# Patient Record
Sex: Male | Born: 1990 | Race: White | Hispanic: No | Marital: Single | Smoking: Former smoker
Health system: Southern US, Community
[De-identification: ages and names within clinical notes are randomized; demographics above are authoritative.]

## PROBLEM LIST (undated history)

## (undated) DIAGNOSIS — R079 Chest pain, unspecified: Secondary | ICD-10-CM

## (undated) DIAGNOSIS — I456 Pre-excitation syndrome: Secondary | ICD-10-CM

## (undated) DIAGNOSIS — F909 Attention-deficit hyperactivity disorder, unspecified type: Secondary | ICD-10-CM

## (undated) HISTORY — PX: NO PAST SURGERIES: SHX2092

## (undated) HISTORY — DX: Attention-deficit hyperactivity disorder, unspecified type: F90.9

## (undated) HISTORY — DX: Pre-excitation syndrome: I45.6

## (undated) HISTORY — DX: Chest pain, unspecified: R07.9

---
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
## 2010-02-06 ENCOUNTER — Emergency Department (HOSPITAL_COMMUNITY): Admission: EM | Admit: 2010-02-06 | Discharge: 2010-02-06 | Payer: Self-pay | Admitting: Emergency Medicine

## 2010-07-22 ENCOUNTER — Emergency Department (HOSPITAL_COMMUNITY): Payer: BC Managed Care – PPO

## 2010-07-22 ENCOUNTER — Emergency Department (HOSPITAL_COMMUNITY)
Admission: EM | Admit: 2010-07-22 | Discharge: 2010-07-22 | Disposition: A | Discharge status: Home / Self Care | Payer: BC Managed Care – PPO | Attending: Emergency Medicine | Admitting: Emergency Medicine

## 2010-07-22 DIAGNOSIS — W298XXA Contact with other powered powered hand tools and household machinery, initial encounter: Secondary | ICD-10-CM | POA: Insufficient documentation

## 2010-07-22 DIAGNOSIS — S99919A Unspecified injury of unspecified ankle, initial encounter: Secondary | ICD-10-CM | POA: Insufficient documentation

## 2010-07-22 DIAGNOSIS — W01119A Fall on same level from slipping, tripping and stumbling with subsequent striking against unspecified sharp object, initial encounter: Secondary | ICD-10-CM | POA: Insufficient documentation

## 2010-07-22 DIAGNOSIS — I456 Pre-excitation syndrome: Secondary | ICD-10-CM | POA: Insufficient documentation

## 2010-07-22 DIAGNOSIS — S8990XA Unspecified injury of unspecified lower leg, initial encounter: Secondary | ICD-10-CM | POA: Insufficient documentation

## 2010-07-22 DIAGNOSIS — M79609 Pain in unspecified limb: Secondary | ICD-10-CM | POA: Insufficient documentation

## 2010-07-22 DIAGNOSIS — Y990 Civilian activity done for income or pay: Secondary | ICD-10-CM | POA: Insufficient documentation

## 2010-07-22 DIAGNOSIS — S91309A Unspecified open wound, unspecified foot, initial encounter: Secondary | ICD-10-CM | POA: Insufficient documentation

## 2010-07-25 LAB — ETHANOL: Alcohol, Ethyl (B): <5 mg/dL (ref 0–10)

## 2010-07-25 LAB — CBC
HCT: 37 % — ABNORMAL LOW (ref 39.0–52.0)
MCHC: 35.2 g/dL (ref 30.0–36.0)
MCV: 92.6 fL (ref 78.0–100.0)
Platelets: 219 10*3/uL (ref 150–400)
RDW: 12.4 % (ref 11.5–15.5)
WBC: 14.6 10*3/uL — ABNORMAL HIGH (ref 4.0–10.5)

## 2010-07-25 LAB — COMPREHENSIVE METABOLIC PANEL
Albumin: 4.3 g/dL (ref 3.5–5.2)
BUN: 18 mg/dL (ref 6–23)
Calcium: 9 mg/dL (ref 8.4–10.5)
Chloride: 109 mEq/L (ref 96–112)
Creatinine, Ser: 0.98 mg/dL (ref 0.4–1.5)
GFR calc Af Amer: >60 mL/min (ref >60)
Total Bilirubin: 0.8 mg/dL (ref 0.3–1.2)

## 2010-07-25 LAB — RAPID URINE DRUG SCREEN, HOSP PERFORMED
Amphetamines: NOT DETECTED
Barbiturates: NOT DETECTED
Benzodiazepines: NOT DETECTED
Cocaine: NOT DETECTED

## 2010-07-25 LAB — DIFFERENTIAL
Basophils Absolute: 0 10*3/uL (ref 0.0–0.1)
Lymphocytes Relative: 13 % (ref 12–46)
Lymphs Abs: 1.9 10*3/uL (ref 0.7–4.0)
Monocytes Absolute: 0.8 10*3/uL (ref 0.1–1.0)
Neutro Abs: 11.9 10*3/uL — ABNORMAL HIGH (ref 1.7–7.7)

## 2010-07-25 LAB — GLUCOSE, CAPILLARY

## 2010-09-02 NOTE — Letter (Signed)
December 07, 2007    Elvina Sidle, MD  [address]  [place], [place] [address]   RE:  NENO, HOHENSEE  MRN:  [id number]  /  DOB:  1990-12-07   Dear Kenyon Ana,   Thank you for referring Jay Lynch for EP evaluation and treatment  of SVT.  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  However in the last several months, his episodes  have increased in frequency and severity and have been associated with  dizziness, lightheadedness, and shortness of breath.  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  The patient states that these  spells typically occur with exercise, although in the last several  months, he has had them occurring spontaneously as previously noted.  He  was seen in your office on December 02, 2007, with the above symptoms.  His palpitations had actually resolved.   His family history is negative.  His parents were both with him today  and no significant problems.   SOCIAL HISTORY:  The patient denies tobacco or alcohol use.  He is  currently soon to be senior in high school.  He likes to wrestle  competitively.   His past medical history is otherwise negative.   His review of systems is as noted in the HPI.  He also notes some mild  headaches with his episodes of SVT.  Otherwise, all systems were  reviewed and were negative except as noted above.   PHYSICAL EXAMINATION:  GENERAL:  The patient is a very pleasant and very  well-appearing 20 year old young  man, in no acute distress.  VITAL SIGNS:  Blood pressure today was 110/64, the pulse was 48 and  regular, the respirations were 18, and the weight was 157 pounds.  HEENT:  Normocephalic and atraumatic.  Pupils equal and round.  The  oropharynx is moist.  His sclerae are anicteric.  NECK:  No jugular venous  distention.  There is no thyromegaly.  Trachea  was midline.  The carotids are 2+ and symmetric.  LUNGS:  Clear bilaterally to auscultation.  No wheezes, rales, or  rhonchi are present.  No increased work of breathing.  CARDIAC:  Regular rate and rhythm.  Normal S1 and S2.  There were no  murmurs, rubs, or gallops are present.  PMI was not enlarged nor was it  laterally displaced.  ABDOMEN:  Soft and nontender.  He was scaphoid.  EXTREMITIES:  No cyanosis, clubbing, or edema.  The pulses were 2+ and  symmetric.  NEUROLOGIC:  Alert and oriented x3.  His cranial nerves were intact.  His strength was 5/5 and symmetric.   His EKG demonstrates sinus rhythm with WPW syndrome and initial EKGs  demonstrate a R-wave in lead V1 and throughout the precordium.  EKG  today demonstrates negative QRS in lead V1 and a positive transition in  lead V2.   IMPRESSION:  Kenyon Ana, thanks for again referring Jay Lynch for EP  evaluation.  He clearly has Wolff-Parkinson-White syndrome and is  symptomatic.  I have outlined two possible treatment options, one would  be the initiation of beta-blocker therapy and the second would be  proceeding with electrophysiologic study and catheter ablation, which  would potentially be curative in this patient.  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  He may also have a second  right posterior septal accessory pathway based on the EKG today.  We  cannot determine about this for certain until we do his  electrophysiologic study.  I will keep you apprised as to how he is  doing.  Thanks again for referring him.    Sincerely,      Doylene Canning. Ladona Ridgel, MD  Electronically Signed    GWT/MedQ  DD: 12/07/2007  DT: 12/08/2007  Job #: [id number]

## 2010-09-02 NOTE — Assessment & Plan Note (Signed)
[facility]                                 ON-CALL NOTE   CAHLIL, SATTAR                      MRN:          [id number]  DATE:12/02/2007                            DOB:          1990/12/17    I received a page through the answering service and I promptly called  Dr. Milus Glazier back.  He stated that Jay Lynch 20 year old male  who is in his office had complained of intermittent chest discomfort.  EKG was obtained that showed Wolff-Parkinson-White syndrome.  Dr.  Milus Glazier felt the patient needed to follow up and was concerned  whether or not he should be placed on medication for his problem.  I  stated I would discuss this with Dr. Daleen Squibb and I would either call him  back or I would have Dr. Daleen Squibb call Dr. Milus Glazier back at the same phone  number [phone] and to await further instructions per our call.  Dr.  Milus Glazier agreed.      Dorian Pod, ACNP  Electronically Signed      Jesse Sans. Daleen Squibb, MD, [facility]  Electronically Signed   MB/MedQ  DD: 12/02/2007  DT: 12/03/2007  Job #: [id number]

## 2010-09-02 NOTE — Discharge Summary (Signed)
NAME:  [name], Jay Lynch             ACCOUNT NO.:  [id number]   MEDICAL RECORD NO.:  [id number]          PATIENT TYPE:  OIB   LOCATION:  3702                         FACILITY:  [facility]   PHYSICIAN:  Bevelyn Buckles. Bensimhon, MDDATE OF BIRTH:  05/22/1990   DATE OF ADMISSION:  12/23/2007  DATE OF DISCHARGE:  12/24/2007                               DISCHARGE SUMMARY   DISCHARGE DIAGNOSIS:  Wolff-Parkinson-White's with failed radiofrequency  ablation.   The patient being discharged home on Toprol-XL 25 mg daily x1 week, then  increase to 50 mg daily.  Follow up with Dr. Ladona Ridgel in 2-3 weeks.  Possible follow up with Dr. Sampson Goon at [facility].   Mr. [name] is a 20 year old Caucasian gentleman seen by Dr. Ladona Ridgel  recently for consultation for Wolff-Parkinson-White syndrome that is  symptomatic at the request of Dr. Elvina Sidle.  The patient was  offered 2 treatment options, initiation of beta-blocker therapy and/or  possible EP study with catheter ablation.  The patient and his  mother/family opted to proceed with EP study.  The patient underwent EP  study on December 23, 2007.  AP could not be successfully ablated.   The patient being discharged to home to follow up with Dr. Ladona Ridgel in 2-3  weeks for further evaluation.  He has been given a prescription for the  Toprol 50 mg half a tablet per week and then a full dose.  He may resume  school/activity slowly starting on Tuesday, December 27, 2007.  Follow  up with Dr. Ladona Ridgel, office will call to arrange appointment.   DURATION OF DISCHARGE ENCOUNTER:  Greater than 30 minutes.      Dorian Pod, ACNP      Bevelyn Buckles. Bensimhon, MD  Electronically Signed    MB/MEDQ  D:  12/24/2007  T:  12/24/2007  Job:  [id number]   cc:   Elvina Sidle, M.D.

## 2010-09-02 NOTE — Op Note (Signed)
NAME:  Jay Lynch, Jay Lynch NO.:  [id number]   MEDICAL RECORD NO.:  [id number]          PATIENT TYPE:  OIB   LOCATION:  3702                         FACILITY:  [facility]   PHYSICIAN:  Hillis Range, MD       DATE OF BIRTH:  1990-08-21   DATE OF PROCEDURE:  DATE OF DISCHARGE:                               OPERATIVE REPORT   EP STUDY NOTE   PRIMARY SURGEON:  Doylene Canning. Ladona Ridgel, MD.   FIRST ASSISTANT:  Hillis Range, MD.   PREPROCEDURE DIAGNOSIS:  Wolff-Parkinson-White syndrome.   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   PROCEDURES:  1. Diagnostic electrophysiology study.  2. Coronary sinus pacing and recording.  3. Mapping of supraventricular tachycardia.  4. Radiofrequency ablation of supraventricular tachycardia.   DESCRIPTION OF THE PROCEDURE:  An informed written consent was obtained  and the patient was brought to the Electrophysiology Lab in a fasting  state.  He was adequately sedated with intravenous medications as  outlined in the nursing report.  The patient's right neck and groin were  prepped and draped in the usual sterile fashion by the EP lab staff.  Using a percutaneous Seldinger technique, one 7-French hemostasis sheath  was placed into the right internal jugular vein.  A 6-French curved  Hexapolar catheter was introduced through the right internal jugular  vein and advanced into the coronary sinus for recording and pacing in  this location.  Two 6-French and one 8-French hemostasis sheaths were  placed into the right common femoral vein.  A 5-French [employer]  CRD catheter was introduced into the right common femoral vein and  advanced into the His bundle location.  A 5-French Josephson catheter  was introduced through the right common femoral vein and advanced into  the His bundle location.  The patient presented to the electrophysiology  lab in normal sinus rhythm.   Pre-excitation was observed on the surface  electrocardiogram.  This EKG pattern revealed a negative QRS complex in  V1 with a transition to a positive R-wave in V2.  I and VL were positive  and the QRS complex in the inferior leads (II,III,VF) were negative.  The R-R interval measured 1057 milliseconds with a PR interval of 92  milliseconds, QRS width 152 milliseconds, and QT interval of 618  milliseconds.  The AH interval measured 87 milliseconds with an HV  interval of 0 milliseconds.  Rapid ventricular pacing was performed down  to the cycle length of 270 milliseconds at which the patient converted  to atrial fibrillation.  The patient was successfully cardioverted to  sinus rhythm with a single synchronized 100 joule biphasic shock with a  cardioversion electrodes in the anterior and posterior thoracic  configuration.  Ventricular extrastimulus testing was performed down to  600/270 milliseconds, which produced non-sustained atrial fibrillation.  The retrograde atrial activation sequence revealed non-decremental VA  conduction with the earliest atrial activation recorded from the CS 5-6  electrode pair.  Atrial extrastimulus testing was then performed, which  revealed non-decremental AV conduction with an atrial ERP of 600/300  milliseconds.  The ERP of the accessory pathway could therefore not be  determined.  A 7-French Biosense Webster Celsius 4-mm ablation catheter  was introduced into the right common femoral vein and advanced into the  right atrium.  Electroanatomical mapping of the accessory pathway was  performed with ventricular pacing.  The earliest retrograde atrial  activation was found to arise just below the mouth of the coronary  sinus.  Radiofrequency current was delivered in this location, and  during ablation the accessory pathway briefly went away and revealed a  second accessory pathway with a positive QRS deflection in V1, which was  felt to be due to a second  accessory pathway.  The second accessory  pathway could not be electroanatomical mapped due to the dominance of  the first pathway.  Additional radiofrequency applications were  delivered in this location with a target temperature of 60 degrees at 50  watts with no effect on the accessory pathway.  Additional  electroanatomical mapping was performed within the coronary sinus,  however, no early atrial activations could be identified during  ventricular pacing in this location.  A coronary sinus diverticula or  middle cardiac vein could not be readily identified. An 8-French  hemostasis sheath was then placed into the right common femoral artery  for blood pressure monitoring.  The ablation catheter was removed from  the body and introduced into the right common femoral artery and  advanced into the left ventricle using a retrograde aortic approach.  Heparin was administered for adequate anti-coagulation intravenously.  Additional electroanatomical mapping along the septal mitral valve  annulus was then performed and a reasonably early atrial activation was  found during ventricular pacing along the septum from the left atrial  side.  Radiofrequency current was delivered in this location with no  effect on the accessory pathway.  Electroanatomical mapping was again  performed along the right periseptal and left periseptal regions as well  as within the coronary sinus.  The accessory pathway could not be  successfully ablated.  The procedure was, therefore, completed.  All  catheters were removed and the sheaths were aspirated and flushed.  The  patient was transferred to the postprocedure area for sheath removal per  protocol.  There were no early apparent complications.   CONCLUSIONS:  1. Normal sinus rhythm upon presentation.  2. Manifest posteroseptal accessory pathway.  3. Spontaneous atrial fibrillation successfully cardioverted.  4. A possible second left lateral accessory pathway  was briefly      observed.  However this pathway could not be electroanatomical      mapped due to the dominance of the presenting pathway.  5. Unsuccessful radiofrequency ablation of the posteroseptal accessory      pathway.  6. No early apparent complications.      Hillis Range, MD  Electronically Signed     JA/MEDQ  D:  12/23/2007  T:  12/24/2007  Job:  [id number]

## 2010-09-05 NOTE — Letter (Signed)
December 30, 2007    Teena Irani. Sampson Goon, MD  [facility] [place], [place] [address]   RE:  AMAURIS, DEBOIS  MRN:  [id number]  /  DOB:  1990/09/10   Dear Onalee Hua,   I am writing to refer you Ashley Jacobs, who recently underwent  electrophysiologic study and attempt at catheter ablation at [facility] on December 23, 2007.  The patient is 20 years old and has had  tachypalpitations for several years including spells of near syncope.  He, on EKG when I initially saw him, had clear-cut sinus rhythm with  preexcitation with a positive delta wave in lead V1 and a right bundle-  branch pattern consistent with a left lateral accessory pathway.  In our  office, however, the patient also had an EKG done, which demonstrated a  negative delta wave in lead II, III, and F, as well as a negative delta  wave in lead V1 transitioning to a positive delta wave in V2, and with  that, underwent an electrophysiologic study and an attempt at catheter  ablation.  At his EP study, mapping during ventricular pacing  demonstrated early, but not particularly early, activation in the right  posteroseptal space, but the earliest activation actually was at a  position in the coronary sinus at approximately 5 o'clock in the LAO  projection.  Initial RF energy application delivered at the lip of the  coronary sinus resulted during coronary sinus pacing in transient  termination of the patient's posteroseptal accessory pathway conduction,  which had also demonstrated activation of a left lateral pathway as  manifested by the right bundle-branch block pattern and continued  preexcitation during CS pacing.  Unfortunately, the patient's  posteroseptal accessory pathway conduction persisted after RF energy  application.  Extensive mapping was subsequently carried out both in the  right posteroseptal as well as the left posteroseptal  space as well as  in the coronary sinus with RF energy application delivered to the atrial  insertion of the pathway from the left posteroseptal side, from the  right posteroseptal side, and within the coronary sinus os, and at early  activation sites inside the coronary sinus.  Despite extensive mapping,  there was never a real early activation with fusion of the A and the V  during the CS pacing.  After several hours of attempted ablation, the  procedure was terminated, and the patient was ultimately discharged home  on beta-blocker therapy.   This particular patient is very active.  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  In reflection, I do not map the middle cardiac vein  area, and I wonder if the patient has a diverticulum in this region,  which might be, where his posteroseptal pathway emanates from.   Ivin Booty lives in [place], [place], and his phone number his  [phone].  Please do not hesitate to contact me for any questions that  might be related to Joshua's care or with regard to his EP study.  I  should also note that during the patient's EP study, pacing from the  right ventricle resulted in spontaneous initiation of atrial  fibrillation, which required cardioversion for termination.  His  earliest preexcited RR  interval was about 270 milliseconds.    Sincerely,      Doylene Canning. Ladona Ridgel, MD  Electronically Signed    GWT/MedQ  DD: 12/30/2007  DT: 12/30/2007  Job #: [id number]

## 2011-03-25 ENCOUNTER — Ambulatory Visit (INDEPENDENT_AMBULATORY_CARE_PROVIDER_SITE_OTHER): Payer: BC Managed Care – PPO

## 2011-03-25 DIAGNOSIS — R937 Abnormal findings on diagnostic imaging of other parts of musculoskeletal system: Secondary | ICD-10-CM

## 2011-03-25 DIAGNOSIS — M25539 Pain in unspecified wrist: Secondary | ICD-10-CM

## 2011-04-08 ENCOUNTER — Ambulatory Visit (INDEPENDENT_AMBULATORY_CARE_PROVIDER_SITE_OTHER): Payer: BC Managed Care – PPO

## 2011-04-08 DIAGNOSIS — S63509A Unspecified sprain of unspecified wrist, initial encounter: Secondary | ICD-10-CM

## 2011-04-08 DIAGNOSIS — M25539 Pain in unspecified wrist: Secondary | ICD-10-CM

## 2011-05-08 ENCOUNTER — Ambulatory Visit (INDEPENDENT_AMBULATORY_CARE_PROVIDER_SITE_OTHER): Payer: BC Managed Care – PPO

## 2011-05-08 DIAGNOSIS — F339 Major depressive disorder, recurrent, unspecified: Secondary | ICD-10-CM

## 2011-05-08 DIAGNOSIS — F988 Other specified behavioral and emotional disorders with onset usually occurring in childhood and adolescence: Secondary | ICD-10-CM

## 2011-05-27 ENCOUNTER — Encounter: Payer: Self-pay | Admitting: *Deleted

## 2011-05-28 ENCOUNTER — Encounter: Payer: Self-pay | Admitting: *Deleted

## 2011-05-28 ENCOUNTER — Ambulatory Visit (INDEPENDENT_AMBULATORY_CARE_PROVIDER_SITE_OTHER)
Admission: RE | Admit: 2011-05-28 | Discharge: 2011-05-28 | Disposition: A | Discharge status: Home / Self Care | Payer: BC Managed Care – PPO | Source: Ambulatory Visit | Attending: Cardiovascular Disease | Admitting: Cardiovascular Disease

## 2011-05-28 ENCOUNTER — Ambulatory Visit (INDEPENDENT_AMBULATORY_CARE_PROVIDER_SITE_OTHER): Payer: BC Managed Care – PPO | Admitting: Cardiovascular Disease

## 2011-05-28 DIAGNOSIS — R079 Chest pain, unspecified: Secondary | ICD-10-CM

## 2011-05-28 DIAGNOSIS — R062 Wheezing: Secondary | ICD-10-CM

## 2011-05-28 DIAGNOSIS — R002 Palpitations: Secondary | ICD-10-CM

## 2011-05-28 DIAGNOSIS — I456 Pre-excitation syndrome: Secondary | ICD-10-CM

## 2011-05-28 NOTE — Assessment & Plan Note (Signed)
Resolved.  Needs echo to R/O associated disease with WPW

## 2011-05-28 NOTE — Progress Notes (Signed)
Patient ID: Jay Lynch, male   DOB: Feb 01, 1991, 20 y.o.   MRN: [id number] 21 yo of Dr Ladona Ridgel.  History of WPW.  Had ablation attempt here in 2009.  Unsuccessful.  Posteroseptal pathway dominant but also ? Of second pathway posterolaterally.  Patient continues to have occasional palpitations.,  Nothing sustained and no syncope.  Given unsuccessful ablation, more than one pathway would not use Adderall  Patient apparantly has ADD and Dr Cleta Alberts was considering this.  No echo on chart to R/O other structural disease.  No SSCP.  Cough with wheezing  Smokes 1.5 ppd.  Would definatley avoid stimulants with all this nicotine on board.  Can consider nonstimulant med like Concerta  ROS: Denies fever, malais, weight loss, blurry vision, decreased visual acuity, cough, sputum, SOB, hemoptysis, pleuritic pain, palpitaitons, heartburn, abdominal pain, melena, lower extremity edema, claudication, or rash.  All other systems reviewed and negative   General: Affect appropriate Healthy:  appears stated age HEENT: normal Neck supple with no adenopathy JVP normal no bruits no thyromegaly Lungs rhonchi and expitory wheezing and good diaphragmatic motion Heart:  S1/S2 no murmur,rub, gallop or click PMI normal Abdomen: benighn, BS positve, no tenderness, no AAA no bruit.  No HSM or HJR Distal pulses intact with no bruits No edema Neuro non-focal Skin warm and dry No muscular weakness  Medications Current Outpatient Prescriptions  Medication Sig Dispense Refill  . escitalopram (LEXAPRO) 10 MG tablet Take 10 mg by mouth daily.        Allergies Phenergan  Family History: No family history on file.  Social History: History   Social History  . Marital Status: Single    Spouse Name: N/A    Number of Children: N/A  . Years of Education: N/A   Occupational History  . Not on file.   Social History Main Topics  . Smoking status: Current Everyday Smoker -- 1.5 packs/day    Types: Cigarettes  .  Smokeless tobacco: Not on file  . Alcohol Use: Not on file  . Drug Use: Not on file  . Sexually Active: Not on file   Other Topics Concern  . Not on file   Social History Narrative  . No narrative on file    Electrocardiogram:  SR rate 58 no baseline preexcitation.  PR 160 normal ECG  Assessment and Plan

## 2011-05-28 NOTE — Patient Instructions (Signed)
Your physician recommends that you schedule a follow-up appointment in: PENDING TEST RESULTS Your physician recommends that you continue on your current medications as directed. Please refer to the Current Medication list given to you today. A chest x-ray takes a picture of the organs and structures inside the chest, including the heart, lungs, and blood vessels. This test can show several things, including, whether the heart is enlarges; whether fluid is building up in the lungs; and whether pacemaker / defibrillator leads are still in place.DX WHEEZING SMOKER Your physician has requested that you have an echocardiogram. Echocardiography is a painless test that uses sound waves to create images of your heart. It provides your doctor with information about the size and shape of your heart and how well your heart's chambers and valves are working. This procedure takes approximately one hour. There are no restrictions for this procedure. DX WPW   Your physician has recommended that you have a pulmonary function test. Pulmonary Function Tests are a group of tests that measure how well air moves in and out of your lungs. DX WHEEZING

## 2011-05-28 NOTE — Assessment & Plan Note (Signed)
WPW failed ablation continued palpitations.  Posteroseptal pathway likely not able to be ablated due to risk of AV block and pacer.   Baseline ECG ok.  No Adderall  F/U Dr Ladona Ridgel in future

## 2011-05-28 NOTE — Assessment & Plan Note (Signed)
Needs CXR and PFTs  Counseled on smoking cessation.  Suggest nicorette gum as he is on lexapro already F/U Daub Inhaler if bronchoreactivity on PFT;s

## 2011-06-04 ENCOUNTER — Other Ambulatory Visit: Payer: Self-pay

## 2011-06-04 ENCOUNTER — Ambulatory Visit (HOSPITAL_COMMUNITY): Payer: BC Managed Care – PPO | Attending: Cardiology | Admitting: Radiology

## 2011-06-04 DIAGNOSIS — I456 Pre-excitation syndrome: Secondary | ICD-10-CM

## 2011-06-04 DIAGNOSIS — R079 Chest pain, unspecified: Secondary | ICD-10-CM | POA: Insufficient documentation

## 2011-06-04 DIAGNOSIS — R002 Palpitations: Secondary | ICD-10-CM | POA: Insufficient documentation

## 2011-06-12 ENCOUNTER — Ambulatory Visit (INDEPENDENT_AMBULATORY_CARE_PROVIDER_SITE_OTHER): Payer: BC Managed Care – PPO | Admitting: Internal Medicine

## 2011-06-12 DIAGNOSIS — R062 Wheezing: Secondary | ICD-10-CM

## 2011-06-12 NOTE — Progress Notes (Signed)
PFT done today. 

## 2011-06-15 LAB — PULMONARY FUNCTION TEST

## 2011-07-02 ENCOUNTER — Encounter: Payer: Self-pay | Admitting: Family Medicine

## 2011-07-02 ENCOUNTER — Encounter: Payer: Self-pay | Admitting: Cardiovascular Disease

## 2011-07-02 ENCOUNTER — Ambulatory Visit (INDEPENDENT_AMBULATORY_CARE_PROVIDER_SITE_OTHER): Payer: BC Managed Care – PPO | Admitting: Family Medicine

## 2011-07-02 DIAGNOSIS — R002 Palpitations: Secondary | ICD-10-CM

## 2011-07-02 DIAGNOSIS — R079 Chest pain, unspecified: Secondary | ICD-10-CM

## 2011-07-02 DIAGNOSIS — R110 Nausea: Secondary | ICD-10-CM

## 2011-07-02 NOTE — Patient Instructions (Addendum)
After consult with cardiology, your chest pain is unlikely to be cardiac in nature.  Take Zantac twice per day for next few days, and avoid spicy foods/fast food, and decrease stimulant use (caffeine and nicotine).  If any worsening of symptoms overnight, or new symptoms - go to emergency Room or call 911.  If symptoms have not resolved, can call cardiology office in the morning to be seen.  Follow up with cardiologist if palpitations persist.

## 2011-07-02 NOTE — Progress Notes (Signed)
Subjective:    Patient ID: Jay Lynch, male    DOB: [birth year], 20 y.o.   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  Had catheter ablation at [facility] 12/23/07, not successful per pt, had 2nd ablation in 02/03/08.  Had been doing ok except every now and then (heart races for 5-10 minutes every 2 or 3 weeks to every month. [name] recently evaluated (Dr. Eden Emms) with echo, and pulmonary testing prior to ADD meds. Has not started any stimulants/new meds.  Off lexapro for 2-3 weeks - rx ran out.   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 No new activities at work.  Sx's started as starting to wash car. Still some soreness in chest.  Feels sick on stomach currently, nausea only, no vomiting.  Short of breath at work only.  Pain 6-7/10.  3/10 currently.  Left and center chest, no arm or jaw radiation.  Slight burping/heartburn symptoms, and ate [employer] 1-1/2 hours prior.  SH: denies energy drinks, but drinks caffeine.  1 and 1/2 cokes today.  3/4 ppd tobacco.  Denies cocaine, or other illicit drug use.  Has been playing basetball for few hours each night without worsening of palpitations and only slight soreness as back into exercise  No recent airline travel, or prolonged car rides, but did notice some pain in Left upper calf, slight swelling after basketball 3 days ago - less sore/swollen now.   Review of Systems  Respiratory: Positive for chest tightness and shortness of breath.        Dyspnea during episode - not currently.  Cardiovascular: Positive for chest pain and palpitations.  Neurological: Negative for syncope.       Objective:   Physical Exam  Constitutional: He is oriented to person, place, and time. Vital signs are normal. He appears well-developed and well-nourished. No distress.       Appears comfortable.  HENT:  Head: Normocephalic  and atraumatic.  Eyes: EOM are normal. Pupils are equal, round, and reactive to light.  Neck: Neck supple. Carotid bruit is not present. No thyromegaly present.  Cardiovascular: Normal rate, regular rhythm, S1 normal, S2 normal, normal heart sounds and intact distal pulses.   No extrasystoles are present. Exam reveals no gallop and no friction rub.   No murmur heard. Pulmonary/Chest: Effort normal and breath sounds normal. He has no wheezes. He has no rales.  Lymphadenopathy:    He has no cervical adenopathy.  Neurological: He is alert and oriented to person, place, and time.  Skin: Skin is warm and dry. No rash noted.  Psychiatric: He has a normal mood and affect. His behavior is normal.    EKG: sinus rhythm, early repol.TWI in 2 only.      Assessment & Plan:  Jay Lynch is a 21 y.o. male Hx of WPW s/p ablation x 2, with intermittent palpitatons (every 3 weeks to 1 month), with acute onset of chest pain, dizziness, slight shortness of breath.  Now improving, appears comfortable in office without distress, no further shortness of breath, HR 60's - 70's on monitor.  No ectopy or pauses., no acute concerning findings on EKG.  D/w cardiologist at [facility] cardi[facility].  Pt had normal echo recently, and unlikely to be cardiac cause of pain, but if symptoms still present in am, call [place] cards for  OV.   Zantac 150mg  given in office and samples x 3 - take twice per day, and avoid spicy foods/fast food, and decrease stimulant use (caffeine and nicotine).  If any worsening of symptoms overnight, or new symptoms - to emergency Room/911.  If symptoms persist in am, pt to follow up with cardiology.  Also encouraged to schedule appointment if palpitation episodes persist.  Understanding expressed and in agreement with plan.

## 2011-07-23 ENCOUNTER — Encounter: Payer: Self-pay | Admitting: Family Medicine

## 2011-07-28 ENCOUNTER — Encounter: Payer: Self-pay | Admitting: Emergency Medicine

## 2012-09-05 ENCOUNTER — Telehealth: Payer: Self-pay | Admitting: *Deleted

## 2012-09-05 NOTE — Telephone Encounter (Signed)
Will fax this note and last office note to Dr. Warren Danes.

## 2012-09-05 NOTE — Telephone Encounter (Signed)
Ok to have work on teeth This is Dr Lubertha Basque patient

## 2012-09-05 NOTE — Telephone Encounter (Signed)
Received call from Dr. Hewitt Blade. Per Dr. Warren Danes pt needs to have wisdom teeth approved. This would be done in office with local anesthesia and nitrous oxide.  Dr. Warren Danes is requesting office notes from visits with Dr. Eden Emms and clearance from Dr. Eden Emms for wisdom teeth removal.  Fax number--[phone]. Phone number--[phone].

## 2012-11-02 ENCOUNTER — Encounter (HOSPITAL_COMMUNITY): Payer: Self-pay | Admitting: Emergency Medicine

## 2012-11-02 ENCOUNTER — Emergency Department (HOSPITAL_COMMUNITY)
Admission: EM | Admit: 2012-11-02 | Discharge: 2012-11-02 | Disposition: A | Discharge status: Home / Self Care | Payer: BC Managed Care – PPO | Attending: Emergency Medicine | Admitting: Emergency Medicine

## 2012-11-02 DIAGNOSIS — S0920XA Traumatic rupture of unspecified ear drum, initial encounter: Secondary | ICD-10-CM | POA: Insufficient documentation

## 2012-11-02 DIAGNOSIS — Y929 Unspecified place or not applicable: Secondary | ICD-10-CM | POA: Insufficient documentation

## 2012-11-02 DIAGNOSIS — Y9389 Activity, other specified: Secondary | ICD-10-CM | POA: Insufficient documentation

## 2012-11-02 DIAGNOSIS — Z8659 Personal history of other mental and behavioral disorders: Secondary | ICD-10-CM | POA: Insufficient documentation

## 2012-11-02 DIAGNOSIS — Z8679 Personal history of other diseases of the circulatory system: Secondary | ICD-10-CM | POA: Insufficient documentation

## 2012-11-02 DIAGNOSIS — H7211 Attic perforation of tympanic membrane, right ear: Secondary | ICD-10-CM

## 2012-11-02 DIAGNOSIS — X58XXXA Exposure to other specified factors, initial encounter: Secondary | ICD-10-CM | POA: Insufficient documentation

## 2012-11-02 DIAGNOSIS — F17200 Nicotine dependence, unspecified, uncomplicated: Secondary | ICD-10-CM | POA: Insufficient documentation

## 2012-11-02 MED ORDER — CIPROFLOXACIN-DEXAMETHASONE 0.3-0.1 % OT SUSP: 4.0000 [drp] | Freq: Two times a day (BID) | OTIC | Status: DC

## 2012-11-02 NOTE — ED Notes (Signed)
PT. REPORTS RIGHT EAR PAIN / BLEEDING INJURED THIS MORNING WITH A Q-TIP .

## 2012-11-02 NOTE — ED Provider Notes (Signed)
   History    CSN: [id number] Arrival date & time 11/02/12  0310  First MD Initiated Contact with Patient 11/02/12 [phone]     Chief Complaint  Patient presents with  . Ear Injury   (Consider location/radiation/quality/duration/timing/severity/associated sxs/prior Treatment) HPI  Patient is a generally healthy young man who sustained trauma to the right ear just PTA. He was sitting on bed with Q tip in his right ear, next to his GF. She nudged him and he fell onto pillow which caused Q tip to become impacted in right ear. Patient noted immediate pain and has had subjective hearing loss. He denies injury to any other region. Pain is mild in severity and aching in nature. Non radiating. Td is utd.  Past Medical History  Diagnosis Date  . Anomalous atrioventricular excitation   . CHEST PAIN   . Wolf-Parkinson-White syndrome   . ADD (attention deficit disorder with hyperactivity)    History reviewed. No pertinent past surgical history. No family history on file. History  Substance Use Topics  . Smoking status: Current Every Day Smoker -- 1.50 packs/day    Types: Cigarettes  . Smokeless tobacco: Not on file  . Alcohol Use: Yes    Review of Systems Gen: no weight loss, fevers, chills, night sweats Nose: no epistaxis or rhinorrhea Ears: as per hpi.  Mouth: no dental pain, no sore throat Neck: no neck pain  Allergies  Promethazine hcl  Home Medications   Current Outpatient Rx  Name  Route  Sig  Dispense  Refill  . ciprofloxacin-dexamethasone (CIPRODEX) otic suspension   Right Ear   Place 4 drops into the right ear 2 (two) times daily.   7.5 mL   0    BP 125/57  Pulse 50  Temp(Src) 97.9 F (36.6 C) (Oral)  Resp 20  SpO2 98% Physical Exam Gen: well developed and well nourished appearing Head: NCAT Eyes: PERL, EOMI Ears: left TM wnl, Righ EAC has small amount of clotted blood, the TM appear to be perforated, no active bleeding, no FB Nose: no epistaixis or  rhinorrhea Mouth/throat: mucosa is moist and pink Neck: supple, no stridor Neuro: CN ii-xii grossly intact, no focal deficits Psyche; normal affect,  calm and cooperative.   ED Course  Procedures (including critical care time) Labs Reviewed - No data to display No results found. 1. Tympanic membrane attic perforation, right     MDM  Case briefly discussed with Dr. Pollyann Kennedy via phone. He recommends water precautions, Ciprodex gtt and f/u in his office in 2 days. Instructions and plan relayed to patient and his father.   Brandt Loosen, MD 11/02/12 [phone]

## 2013-01-21 ENCOUNTER — Emergency Department (HOSPITAL_COMMUNITY)
Admission: EM | Admit: 2013-01-21 | Discharge: 2013-01-22 | Disposition: A | Discharge status: Home / Self Care | Payer: BC Managed Care – PPO | Attending: Emergency Medicine | Admitting: Emergency Medicine

## 2013-01-21 ENCOUNTER — Emergency Department (HOSPITAL_COMMUNITY): Payer: BC Managed Care – PPO

## 2013-01-21 ENCOUNTER — Encounter (HOSPITAL_COMMUNITY): Payer: Self-pay | Admitting: *Deleted

## 2013-01-21 DIAGNOSIS — Y929 Unspecified place or not applicable: Secondary | ICD-10-CM | POA: Insufficient documentation

## 2013-01-21 DIAGNOSIS — M25552 Pain in left hip: Secondary | ICD-10-CM

## 2013-01-21 DIAGNOSIS — Y9389 Activity, other specified: Secondary | ICD-10-CM | POA: Insufficient documentation

## 2013-01-21 DIAGNOSIS — W208XXA Other cause of strike by thrown, projected or falling object, initial encounter: Secondary | ICD-10-CM | POA: Insufficient documentation

## 2013-01-21 DIAGNOSIS — T148XXA Other injury of unspecified body region, initial encounter: Secondary | ICD-10-CM

## 2013-01-21 DIAGNOSIS — S79919A Unspecified injury of unspecified hip, initial encounter: Secondary | ICD-10-CM | POA: Insufficient documentation

## 2013-01-21 DIAGNOSIS — Z8659 Personal history of other mental and behavioral disorders: Secondary | ICD-10-CM | POA: Insufficient documentation

## 2013-01-21 DIAGNOSIS — F17200 Nicotine dependence, unspecified, uncomplicated: Secondary | ICD-10-CM | POA: Insufficient documentation

## 2013-01-21 DIAGNOSIS — Z8679 Personal history of other diseases of the circulatory system: Secondary | ICD-10-CM | POA: Insufficient documentation

## 2013-01-21 DIAGNOSIS — S20229A Contusion of unspecified back wall of thorax, initial encounter: Secondary | ICD-10-CM | POA: Insufficient documentation

## 2013-01-21 MED ORDER — METHOCARBAMOL 500 MG PO TABS
750.0000 mg | ORAL_TABLET | Freq: Once | ORAL | Status: AC
  Administered 2013-01-21: 750 mg via ORAL
  Filled 2013-01-21 (×2): qty 2

## 2013-01-21 MED ORDER — IBUPROFEN 400 MG PO TABS
600.0000 mg | ORAL_TABLET | Freq: Once | ORAL | Status: AC
  Administered 2013-01-21: 600 mg via ORAL
  Filled 2013-01-21 (×2): qty 1

## 2013-01-21 NOTE — ED Provider Notes (Signed)
CSN: [id number]     Arrival date & time 01/21/13  2236 History   First MD Initiated Contact with Patient 01/21/13 2247     Chief Complaint  Patient presents with  . Back Pain  . Hip Pain   (Consider location/radiation/quality/duration/timing/severity/associated sxs/prior Treatment) HPI Comments: The car fell on him--he rolled to the side and it hit him on the L iliac crest. The car was jacked up and he continued to work.  He has not taken any pain medication, he has been ambulatory but with progressive increase in pain   Patient is a 22 y.o. male presenting with back pain and hip pain. The history is provided by the patient.  Back Pain Location:  Lumbar spine Quality:  Aching Radiates to:  Does not radiate Pain severity:  Moderate Onset quality:  Sudden Duration:  11 hours Progression:  Worsening Chronicity:  New Context comment:  Car fell off lift landing on patient  Relieved by:  None tried Worsened by:  Ambulation Ineffective treatments:  None tried Associated symptoms: pelvic pain   Associated symptoms: no abdominal pain, no bladder incontinence, no bowel incontinence, no fever, no leg pain, no numbness, no paresthesias, no perianal numbness, no tingling and no weakness   Hip Pain Pertinent negatives include no abdominal pain, chills, fever, joint swelling, numbness or weakness.    Past Medical History  Diagnosis Date  . Anomalous atrioventricular excitation   . CHEST PAIN   . Wolf-Parkinson-White syndrome   . ADD (attention deficit disorder with hyperactivity)    History reviewed. No pertinent past surgical history. History reviewed. No pertinent family history. History  Substance Use Topics  . Smoking status: Current Every Day Smoker -- 1.50 packs/day    Types: Cigarettes  . Smokeless tobacco: Not on file  . Alcohol Use: Yes    Review of Systems  Constitutional: Negative for fever and chills.  Gastrointestinal: Negative for abdominal pain and bowel  incontinence.  Genitourinary: Positive for pelvic pain. Negative for bladder incontinence and flank pain.  Musculoskeletal: Positive for back pain and gait problem. Negative for joint swelling.  Skin: Negative for wound.  Neurological: Negative for tingling, weakness, numbness and paresthesias.  All other systems reviewed and are negative.    Allergies  Promethazine hcl  Home Medications   Current Outpatient Rx  Name  Route  Sig  Dispense  Refill  . HYDROcodone-acetaminophen (NORCO/VICODIN) 5-325 MG per tablet   Oral   Take 1 tablet by mouth every 4 (four) hours as needed for pain (sever pain at night).   10 tablet   0   . ibuprofen (ADVIL,MOTRIN) 600 MG tablet   Oral   Take 1 tablet (600 mg total) by mouth every 8 (eight) hours as needed for pain.   30 tablet   0   . methocarbamol (ROBAXIN) 750 MG tablet   Oral   Take 1 tablet (750 mg total) by mouth 3 (three) times daily.   15 tablet   0    BP 135/70  Pulse 114  Temp(Src) 98.2 F (36.8 C) (Oral)  Resp 16  Wt 176 lb 7 oz (80.032 kg)  BMI 24.27 kg/m2  SpO2 100% Physical Exam  Nursing note and vitals reviewed. Constitutional: He is oriented to person, place, and time. He appears well-developed and well-nourished.  HENT:  Head: Normocephalic and atraumatic.  Eyes: Pupils are equal, round, and reactive to light.  Neck: Normal range of motion.  Cardiovascular: Normal rate.   Pulmonary/Chest: Effort normal.  Abdominal:  Soft. He exhibits no distension.  Musculoskeletal: Normal range of motion. He exhibits tenderness. He exhibits no edema.       Back:  Neurological: He is alert and oriented to person, place, and time.  Skin: Skin is warm.    ED Course  Procedures (including critical care time) Labs Review Labs Reviewed - No data to display Imaging Review Dg Pelvis 1-2 Views  01/21/2013   *RADIOLOGY REPORT*  Clinical Data: Car fell onto patient; left hip pain.  PELVIS - 1-2 VIEW  Comparison: None.  Findings:  There is no evidence of fracture or dislocation.  Both femoral heads are seated normally within their respective acetabula.  No significant degenerative change is appreciated.  The sacroiliac joints are unremarkable in appearance.  The visualized bowel gas pattern is grossly unremarkable in appearance.  IMPRESSION: No evidence of fracture or dislocation.   Original Report Authenticated By: Tonia Ghent, M.D.    MDM   1. Contusion   2. Hip pain, acute, left    Xray is normal patient will be treated with antiinflammatory , muscle relaxer and Vicodin at night     Arman Filter, NP 01/22/13 0020  Arman Filter, NP 01/22/13 0020

## 2013-01-21 NOTE — ED Notes (Addendum)
Pt states that he was working on his car and his transmission came out and then the car landed on his left hip and back. Pt states that he then had his girlfriend jack the car back up and continued to work on the car. Pt states after he was finished he wanted to be seen. Pt ambualtory/able to bare weight.

## 2013-01-22 MED ORDER — IBUPROFEN 600 MG PO TABS: 600.0000 mg | ORAL_TABLET | Freq: Three times a day (TID) | ORAL | Status: DC | PRN

## 2013-01-22 MED ORDER — METHOCARBAMOL 750 MG PO TABS: 750.0000 mg | ORAL_TABLET | Freq: Three times a day (TID) | ORAL | Status: DC

## 2013-01-22 MED ORDER — HYDROCODONE-ACETAMINOPHEN 5-325 MG PO TABS: 1.0000 | ORAL_TABLET | ORAL | Status: DC | PRN

## 2013-01-22 NOTE — ED Provider Notes (Signed)
Medical screening examination/treatment/procedure(s) were performed by non-physician practitioner and as supervising physician I was immediately available for consultation/collaboration.   Darlys Gales, MD 01/22/13 [phone]

## 2013-09-20 ENCOUNTER — Telehealth: Payer: Self-pay | Admitting: Cardiovascular Disease

## 2013-09-20 NOTE — Telephone Encounter (Signed)
LEFT MESSAGE FOR  CALL BACK ./CY 

## 2013-09-20 NOTE — Telephone Encounter (Signed)
New problem   Pt's mom need a release letter for pt to start donating plasma. Please advise.

## 2013-09-28 NOTE — Telephone Encounter (Signed)
LMTCB ./CY WILL AWAIT  RETURN CALL FROM PT'S MOM./CY

## 2014-05-31 ENCOUNTER — Ambulatory Visit (INDEPENDENT_AMBULATORY_CARE_PROVIDER_SITE_OTHER): Payer: BLUE CROSS/BLUE SHIELD | Admitting: Family Medicine

## 2014-05-31 VITALS — BP 118/68 | HR 64 | Temp 98.2°F | Resp 16 | Ht 71.25 in | Wt 187.2 lb

## 2014-05-31 DIAGNOSIS — Z202 Contact with and (suspected) exposure to infections with a predominantly sexual mode of transmission: Secondary | ICD-10-CM

## 2014-05-31 DIAGNOSIS — R059 Cough, unspecified: Secondary | ICD-10-CM

## 2014-05-31 DIAGNOSIS — R05 Cough: Secondary | ICD-10-CM

## 2014-05-31 DIAGNOSIS — L409 Psoriasis, unspecified: Secondary | ICD-10-CM

## 2014-05-31 DIAGNOSIS — M25562 Pain in left knee: Secondary | ICD-10-CM

## 2014-05-31 DIAGNOSIS — M25561 Pain in right knee: Secondary | ICD-10-CM

## 2014-05-31 DIAGNOSIS — Z720 Tobacco use: Secondary | ICD-10-CM

## 2014-05-31 MED ORDER — AZITHROMYCIN 250 MG PO TABS
ORAL_TABLET | ORAL | Status: DC
Start: 2014-05-31 — End: 2015-06-14

## 2014-05-31 MED ORDER — TRIAMCINOLONE ACETONIDE 0.1 % EX CREA: 1.0000 "application " | TOPICAL_CREAM | Freq: Two times a day (BID) | CUTANEOUS | Status: DC

## 2014-05-31 NOTE — Patient Instructions (Addendum)
You should receive a call or letter about your lab results within the next week to 10 days.  Start azithromycin as discussed. I do recommend other testing such as HIV and syphilis testing as well.  Let me know if you change your mind.   Your rash on arms and knees appears to be psoriasis and this can sometimes also cause arthritis or knee pain.  You can start with the steroid cream to your elbows and knees, advil or alleve for pain for now, but return to discuss this further and other possible workup or treatment.   mucinex if needed for cough.  If cough not improving in next week - recheck for possible xray.  Keep up with cutting back on smoking. [facility] offers smoking cessation clinics. Registration is required. To register call [phone] or register online at [email or web address].   Return to the clinic or go to the nearest emergency room if any of your symptoms worsen or new symptoms occur.  Psoriasis Psoriasis is a common, long-lasting (chronic) inflammation of the skin. It affects both men and women equally, of all ages and all races. Psoriasis cannot be passed from person to person (not contagious). Psoriasis varies from mild to very severe. When severe, it can greatly affect your quality of life. Psoriasis is an inflammatory disorder affecting the skin as well as other organs including the joints (causing an arthritis). With psoriasis, the skin sheds its top layer of cells more rapidly than it does in someone without psoriasis. CAUSES  The cause of psoriasis is largely unknown. Genetics, your immune system, and the environment seem to play a role in causing psoriasis. Factors that can make psoriasis worse include:  Damage or trauma to the skin, such as cuts, scrapes, and sunburn. This damage often causes new areas of psoriasis (lesions).  Winter dryness and lack of sunlight.  Medicines such as lithium, beta-blockers, antimalarial drugs, ACE inhibitors, nonsteroidal anti-inflammatory  drugs (ibuprofen, aspirin), and terbinafine. Let your caregiver know if you are taking any of these drugs.  Alcohol. Excessive alcohol use should be avoided if you have psoriasis. Drinking large amounts of alcohol can affect:  How well your psoriasis treatment works.  How safe your psoriasis treatment is.  Smoking. If you smoke, ask your caregiver for help to quit.  Stress.  Bacterial or viral infections.  Arthritis. Arthritis associated with psoriasis (psoriatic arthritis) affects less than 10% of patients with psoriasis. The arthritic intensity does not always match the skin psoriasis intensity. It is important to let your caregiver know if your joints hurt or if they are stiff. SYMPTOMS  The most common form of psoriasis begins with little red bumps that gradually become larger. The bumps begin to form scales that flake off easily. The lower layers of scales stick together. When these scales are scratched or removed, the underlying skin is tender and bleeds easily. These areas then grow in size and may become large. Psoriasis often creates a rash that looks the same on both sides of the body (symmetrical). It often affects the elbows, knees, groin, genitals, arms, legs, scalp, and nails. Affected nails often have pitting, loosen, thicken, crumble, and are difficult to treat.  "Inverse psoriasis"occurs in the armpits, under breasts, in skin folds, and around the groin, buttocks, and genitals.  "Guttate psoriasis" generally occurs in children and young adults following a recent sore throat (strep throat). It begins with many small, red, scaly spots on the skin. It clears spontaneously in weeks or a few months  without treatment. DIAGNOSIS  Psoriasis is diagnosed by physical exam. A tissue sample (biopsy) may also be taken. TREATMENT The treatment of psoriasis depends on your age, health, and living conditions.  Steroid (cortisone) creams, lotions, and ointments may be used. These  treatments are associated with thinning of the skin, blood vessels that get larger (dilated), loss of skin pigmentation, and easy bruising. It is important to use these steroids as directed by your caregiver. Only treat the affected areas and not the normal, unaffected skin. People on long-term steroid treatment should wear a medical alert bracelet. Injections may be used in areas that are difficult to treat.  Scalp treatments are available as shampoos, solutions, sprays, foams, and oils. Avoid scratching the scalp and picking at the scales.  Anthralin medicine works well on areas that are difficult to treat. However, it stains clothes and skin and may cause temporary irritation.  Synthetic vitamin D (calcipotriene)can be used on small areas. It is available by prescription. The forms of synthetic vitamin D available in health food stores do not help with psoriasis.  Coal tarsare available in various strengths for psoriasis that is difficult to treat. They are one of the longest used treatments for difficult to treat psoriasis. However, they are messy to use.  Light therapy (UV therapy) can be carefully and professionally monitored in a dermatologist's office. Careful sunbathing is helpful for many people as directed by your caregiver. The exposure should be just long enough to cause a mild redness (erythema) of your skin. Avoid sunburn as this may make the condition worse. Sunscreen (SPF of 30 or higher) should be used to protect against sunburn. Cataracts, wrinkles, and skin aging are some of the harmful side effects of light therapy.  If creams (topical medicines) fail, there are several other options for systemic or oral medicines your caregiver can suggest. Psoriasis can sometimes be very difficult to treat. It can come and go. It is necessary to follow up with your caregiver regularly if your psoriasis is difficult to treat. Usually, with persistence you can get a good amount of relief.  Maintaining consistent care is important. Do not change caregivers just because you do not see immediate results. It may take several trials to find the right combination of treatment for you. PREVENTING FLARE-UPS  Wear gloves while you wash dishes, while cleaning, and when you are outside in the cold.  If you have radiators, place a bowl of water or damp towel on the radiator. This will help put water back in the air. You can also use a humidifier to keep the air moist. Try to keep the humidity at about 60% in your home.  Apply moisturizer while your skin is still damp from bathing or showering. This traps water in the skin.  Avoid long, hot baths or showers. Keep soap use to a minimum. Soaps dry out the skin and wash away the protective oils. Use a fragrance free, dye free soap.  Drink enough water and fluids to keep your urine clear or pale yellow. Not drinking enough water depletes your skin's water supply.  Turn off the heat at night and keep it low during the day. Cool air is less drying. SEEK MEDICAL CARE IF:  You have increasing pain in the affected areas.  You have uncontrolled bleeding in the affected areas.  You have increasing redness or warmth in the affected areas.  You start to have pain or stiffness in your joints.  You start feeling depressed about your condition.  You have a fever. Document Released: 04/03/2000 Document Revised: 06/29/2011 Document Reviewed: 09/29/2010 [facility] Patient Information 2015 [place], [place]. This information is not intended to replace advice given to you by your health care provider. Make sure you discuss any questions you have with your health care provider.  Cough, Adult  A cough is a reflex that helps clear your throat and airways. It can help heal the body or may be a reaction to an irritated airway. A cough may only last 2 or 3 weeks (acute) or may last more than 8 weeks (chronic).  CAUSES Acute cough:  Viral or bacterial  infections. Chronic cough:  Infections.  Allergies.  Asthma.  Post-nasal drip.  Smoking.  Heartburn or acid reflux.  Some medicines.  Chronic lung problems (COPD).  Cancer. SYMPTOMS   Cough.  Fever.  Chest pain.  Increased breathing rate.  High-pitched whistling sound when breathing (wheezing).  Colored mucus that you cough up (sputum). TREATMENT   A bacterial cough may be treated with antibiotic medicine.  A viral cough must run its course and will not respond to antibiotics.  Your caregiver may recommend other treatments if you have a chronic cough. HOME CARE INSTRUCTIONS   Only take over-the-counter or prescription medicines for pain, discomfort, or fever as directed by your caregiver. Use cough suppressants only as directed by your caregiver.  Use a cold steam vaporizer or humidifier in your bedroom or home to help loosen secretions.  Sleep in a semi-upright position if your cough is worse at night.  Rest as needed.  Stop smoking if you smoke. SEEK IMMEDIATE MEDICAL CARE IF:   You have pus in your sputum.  Your cough starts to worsen.  You cannot control your cough with suppressants and are losing sleep.  You begin coughing up blood.  You have difficulty breathing.  You develop pain which is getting worse or is uncontrolled with medicine.  You have a fever. MAKE SURE YOU:   Understand these instructions.  Will watch your condition.  Will get help right away if you are not doing well or get worse. Document Released: 10/03/2010 Document Revised: 06/29/2011 Document Reviewed: 10/03/2010 [facility] Patient Information 2015 [place], [place]. This information is not intended to replace advice given to you by your health care provider. Make sure you discuss any questions you have with your health care provider.

## 2014-05-31 NOTE — Progress Notes (Signed)
Subjective:  This chart was scribed for Corning IncorporatedJeffrey R. Neva SeatGreene, MD by Richarda Overlieichard Holland, Medical scribe. This patient was seen in ROOM 2 and the patient's care was started 6:07 PM.   Patient ID: Jay CorneaJoseph E Viegas, male    DOB: 1990-07-11, 24 y.o.   MRN: [id number]   Chief Complaint  Patient presents with  . Cough    Off and on X1week  . Generalized Body Aches  . Exposed to Chlymidia    Found out yesterday   HPI HPI Comments: Jay Lynch is a 24 y.o. male with a history of wolf-parkinson-white syndrome who [facility] complaining of an intermittent, dry cough for the last week. Pt reports associated minimal rhinorrhea and congestion. 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 He says that his cough worsens with exertion. Pt reports a history of smoking 0.3ppd and uses a vaporizer. He denies fever, SOB, wheezing and CP.   He states that he has been exposed to chlamydia which he found out yesterday. Pt reports his last sexual encounter with his only partner was 3 weeks ago. He states that he has had unprotected sex with his partner in the last 2 months. Pt denies any history of STD's. He says thinks he has been STD screened 2 years ago. Pt reports a lifetime history of 2 sexual partners. He denies penile discharge, dysuria, testicular pain, abdominal pain and rashes as symptoms.   Pt complains of generalized body aches as well, specifically his knees since he was [age] or 24 years old. Pt reports that he has had to have fluid drained from his left knee in the past. He says that when it gets cold his knee pain worsens.    Patient Active Problem List   Diagnosis Date Noted  . Wheezing 05/28/2011  . 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 CHEST PAIN 11/26/2008   Past Medical History  Diagnosis Date  . Anomalous atrioventricular excitation   . CHEST PAIN   . Wolf-Parkinson-White syndrome   . ADD (attention deficit disorder with  hyperactivity)    No past surgical history on file. Allergies  Allergen Reactions  . Promethazine Hcl Palpitations and Other (See Comments)    shaking   Prior to Admission medications   Not on File   History   Social History  . Marital Status: Single    Spouse Name: N/A  . Number of Children: N/A  . Years of Education: N/A   Occupational History  . Not on file.   Social History Main Topics  . Smoking status: Current Every Day Smoker -- 1.50 packs/day    Types: Cigarettes  . Smokeless tobacco: Not on file  . Alcohol Use: Yes  . Drug Use: Not on file  . Sexual Activity: Not on file   Other Topics Concern  . Not on file   Social History Narrative   Review of Systems  Constitutional: Negative for fever, fatigue and unexpected weight change.  HENT: Positive for congestion and rhinorrhea.   Eyes: Negative for visual disturbance.  Respiratory: Positive for cough. Negative for chest tightness, shortness of breath and wheezing.   Cardiovascular: Negative for chest pain, palpitations and leg swelling.  Gastrointestinal: Negative for abdominal pain and blood in stool.  Genitourinary: Negative for dysuria, discharge and testicular pain.  Musculoskeletal: Positive for myalgias.  Skin: Negative for rash.  Neurological: Negative for dizziness, light-headedness and headaches.  Objective:   Physical Exam  Constitutional: He is oriented to person, place, and time. He appears well-developed and well-nourished.  HENT:  Head: Normocephalic and atraumatic.  Right Ear: Tympanic membrane, external ear and ear canal normal.  Left Ear: Tympanic membrane, external ear and ear canal normal.  Nose: No rhinorrhea.  Mouth/Throat: Oropharynx is clear and moist and mucous membranes are normal. No oropharyngeal exudate or posterior oropharyngeal erythema.  Eyes: Conjunctivae are normal. Pupils are equal, round, and reactive to light.  Neck: Neck supple.  Cardiovascular: Normal rate,  regular rhythm, normal heart sounds and intact distal pulses.   No murmur heard. Pulmonary/Chest: Effort normal and breath sounds normal. He has no wheezes. He has no rhonchi. He has no rales.  Abdominal: Soft. There is no tenderness.  Genitourinary:  No testicular tendnerss, no rash, no penile discharge. No inguinal lymphadenopathy.   Musculoskeletal:  Dry scaling patch on the anterior aspect of both as well as the extensor surfaces of both elbows. Full ROM in both knees. No effusion. No focal bony tenderness.     Lymphadenopathy:    He has no cervical adenopathy.  Neurological: He is alert and oriented to person, place, and time.  Skin: Skin is warm and dry. No rash noted.  Psychiatric: He has a normal mood and affect. His behavior is normal.  Vitals reviewed.   Filed Vitals:   05/31/14 1651  BP: 118/68  Pulse: 64  Temp: 98.2 F (36.8 C)  TempSrc: Oral  Resp: 16  Height: 5' 11.25" (1.81 m)  Weight: 187 lb 3.2 oz (84.913 kg)  SpO2: 100%      Assessment & Plan:   KIMARI COUDRIET is a 24 y.o. male Cough  Exposure to chlamydia - Plan: GC/chlamydia probe amp, urine, azithromycin (ZITHROMAX) 250 MG tablet  -will treat with 1gram azithromycin.  Testing fo rGC/CT performed.  Recommended other STI testing - especially HIV and RPR but these were declined at this time. Recommended to have done hee or elsewhere if he changes his mind.  Safer sex practices discussed.   Arthralgia of both knees, Psoriasis - Plan: triamcinolone cream (KENALOG) 0.1 %  -suspected psoriasis based on skin findings on extensor surfaces.   -TAC 0.1% BID prn, and return to discuss knee pain further and other eval/testing to see if his knee pain could be from psoriatic arthritis. RTC precautions.   Tobacco abuse, cough  -possible viral URI. Sx care, smoking cessation discussed.  RTC precautions if cough persists mucinex if needed for cough.   Meds ordered this encounter  Medications  . azithromycin  (ZITHROMAX) 250 MG tablet    Sig: Take 4 tabs by mouth once    Dispense:  4 tablet    Refill:  0  . triamcinolone cream (KENALOG) 0.1 %    Sig: Apply 1 application topically 2 (two) times daily.    Dispense:  30 g    Refill:  0   Patient Instructions  You should receive a call or letter about your lab results within the next week to 10 days.  Start azithromycin as discussed. I do recommend other testing such as HIV and syphilis testing as well.  Let me know if you change your mind.   Your rash on arms and knees appears to be psoriasis and this can sometimes also cause arthritis or knee pain.  You can start with the steroid cream to your elbows and knees, advil or alleve for pain for now, but return to discuss this  further and other possible workup or treatment.   mucinex if needed for cough.  If cough not improving in next week - recheck for possible xray.  Keep up with cutting back on smoking. [place] offers smoking cessation clinics. Registration is required. To register call [phone] or register online at [email or web address].   Return to the clinic or go to the nearest emergency room if any of your symptoms worsen or new symptoms occur.  Psoriasis Psoriasis is a common, long-lasting (chronic) inflammation of the skin. It affects both men and women equally, of all ages and all races. Psoriasis cannot be passed from person to person (not contagious). Psoriasis varies from mild to very severe. When severe, it can greatly affect your quality of life. Psoriasis is an inflammatory disorder affecting the skin as well as other organs including the joints (causing an arthritis). With psoriasis, the skin sheds its top layer of cells more rapidly than it does in someone without psoriasis. CAUSES  The cause of psoriasis is largely unknown. Genetics, your immune system, and the environment seem to play a role in causing psoriasis. Factors that can make psoriasis worse include:  Damage or trauma  to the skin, such as cuts, scrapes, and sunburn. This damage often causes new areas of psoriasis (lesions).  Winter dryness and lack of sunlight.  Medicines such as lithium, beta-blockers, antimalarial drugs, ACE inhibitors, nonsteroidal anti-inflammatory drugs (ibuprofen, aspirin), and terbinafine. Let your caregiver know if you are taking any of these drugs.  Alcohol. Excessive alcohol use should be avoided if you have psoriasis. Drinking large amounts of alcohol can affect:  How well your psoriasis treatment works.  How safe your psoriasis treatment is.  Smoking. If you smoke, ask your caregiver for help to quit.  Stress.  Bacterial or viral infections.  Arthritis. Arthritis associated with psoriasis (psoriatic arthritis) affects less than 10% of patients with psoriasis. The arthritic intensity does not always match the skin psoriasis intensity. It is important to let your caregiver know if your joints hurt or if they are stiff. SYMPTOMS  The most common form of psoriasis begins with little red bumps that gradually become larger. The bumps begin to form scales that flake off easily. The lower layers of scales stick together. When these scales are scratched or removed, the underlying skin is tender and bleeds easily. These areas then grow in size and may become large. Psoriasis often creates a rash that looks the same on both sides of the body (symmetrical). It often affects the elbows, knees, groin, genitals, arms, legs, scalp, and nails. Affected nails often have pitting, loosen, thicken, crumble, and are difficult to treat.  "Inverse psoriasis"occurs in the armpits, under breasts, in skin folds, and around the groin, buttocks, and genitals.  "Guttate psoriasis" generally occurs in children and young adults following a recent sore throat (strep throat). It begins with many small, red, scaly spots on the skin. It clears spontaneously in weeks or a few months without treatment. DIAGNOSIS    Psoriasis is diagnosed by physical exam. A tissue sample (biopsy) may also be taken. TREATMENT The treatment of psoriasis depends on your age, health, and living conditions.  Steroid (cortisone) creams, lotions, and ointments may be used. These treatments are associated with thinning of the skin, blood vessels that get larger (dilated), loss of skin pigmentation, and easy bruising. It is important to use these steroids as directed by your caregiver. Only treat the affected areas and not the normal, unaffected skin. People on long-term steroid  treatment should wear a medical alert bracelet. Injections may be used in areas that are difficult to treat.  Scalp treatments are available as shampoos, solutions, sprays, foams, and oils. Avoid scratching the scalp and picking at the scales.  Anthralin medicine works well on areas that are difficult to treat. However, it stains clothes and skin and may cause temporary irritation.  Synthetic vitamin D (calcipotriene)can be used on small areas. It is available by prescription. The forms of synthetic vitamin D available in health food stores do not help with psoriasis.  Coal tarsare available in various strengths for psoriasis that is difficult to treat. They are one of the longest used treatments for difficult to treat psoriasis. However, they are messy to use.  Light therapy (UV therapy) can be carefully and professionally monitored in a dermatologist's office. Careful sunbathing is helpful for many people as directed by your caregiver. The exposure should be just long enough to cause a mild redness (erythema) of your skin. Avoid sunburn as this may make the condition worse. Sunscreen (SPF of 30 or higher) should be used to protect against sunburn. Cataracts, wrinkles, and skin aging are some of the harmful side effects of light therapy.  If creams (topical medicines) fail, there are several other options for systemic or oral medicines your caregiver can  suggest. Psoriasis can sometimes be very difficult to treat. It can come and go. It is necessary to follow up with your caregiver regularly if your psoriasis is difficult to treat. Usually, with persistence you can get a good amount of relief. Maintaining consistent care is important. Do not change caregivers just because you do not see immediate results. It may take several trials to find the right combination of treatment for you. PREVENTING FLARE-UPS  Wear gloves while you wash dishes, while cleaning, and when you are outside in the cold.  If you have radiators, place a bowl of water or damp towel on the radiator. This will help put water back in the air. You can also use a humidifier to keep the air moist. Try to keep the humidity at about 60% in your home.  Apply moisturizer while your skin is still damp from bathing or showering. This traps water in the skin.  Avoid long, hot baths or showers. Keep soap use to a minimum. Soaps dry out the skin and wash away the protective oils. Use a fragrance free, dye free soap.  Drink enough water and fluids to keep your urine clear or pale yellow. Not drinking enough water depletes your skin's water supply.  Turn off the heat at night and keep it low during the day. Cool air is less drying. SEEK MEDICAL CARE IF:  You have increasing pain in the affected areas.  You have uncontrolled bleeding in the affected areas.  You have increasing redness or warmth in the affected areas.  You start to have pain or stiffness in your joints.  You start feeling depressed about your condition.  You have a fever. Document Released: 04/03/2000 Document Revised: 06/29/2011 Document Reviewed: 09/29/2010 [facility] Patient Information 2015 [place], [place]. This information is not intended to replace advice given to you by your health care provider. Make sure you discuss any questions you have with your health care provider.  Cough, Adult  A cough is a reflex that  helps clear your throat and airways. It can help heal the body or may be a reaction to an irritated airway. A cough may only last 2 or 3 weeks (acute)  or may last more than 8 weeks (chronic).  CAUSES Acute cough:  Viral or bacterial infections. Chronic cough:  Infections.  Allergies.  Asthma.  Post-nasal drip.  Smoking.  Heartburn or acid reflux.  Some medicines.  Chronic lung problems (COPD).  Cancer. SYMPTOMS   Cough.  Fever.  Chest pain.  Increased breathing rate.  High-pitched whistling sound when breathing (wheezing).  Colored mucus that you cough up (sputum). TREATMENT   A bacterial cough may be treated with antibiotic medicine.  A viral cough must run its course and will not respond to antibiotics.  Your caregiver may recommend other treatments if you have a chronic cough. HOME CARE INSTRUCTIONS   Only take over-the-counter or prescription medicines for pain, discomfort, or fever as directed by your caregiver. Use cough suppressants only as directed by your caregiver.  Use a cold steam vaporizer or humidifier in your bedroom or home to help loosen secretions.  Sleep in a semi-upright position if your cough is worse at night.  Rest as needed.  Stop smoking if you smoke. SEEK IMMEDIATE MEDICAL CARE IF:   You have pus in your sputum.  Your cough starts to worsen.  You cannot control your cough with suppressants and are losing sleep.  You begin coughing up blood.  You have difficulty breathing.  You develop pain which is getting worse or is uncontrolled with medicine.  You have a fever. MAKE SURE YOU:   Understand these instructions.  Will watch your condition.  Will get help right away if you are not doing well or get worse. Document Released: 10/03/2010 Document Revised: 06/29/2011 Document Reviewed: 10/03/2010 [facility] Patient Information 2015 [place], [place]. This information is not intended to replace advice given to you by your  health care provider. Make sure you discuss any questions you have with your health care provider.     I personally performed the services described in this documentation, which was scribed in my presence. The recorded information has been reviewed and considered, and addended by me as needed.

## 2014-06-02 LAB — GC/CHLAMYDIA PROBE AMP, URINE
Chlamydia, Swab/Urine, PCR: POSITIVE — AB
GC Probe Amp, Urine: NEGATIVE
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
## 2015-06-06 ENCOUNTER — Telehealth: Payer: Self-pay | Admitting: Cardiovascular Disease

## 2015-06-06 NOTE — Telephone Encounter (Signed)
Called pt and left a VM for pt to call back to update Fm and medical Hx.

## 2015-06-07 NOTE — Progress Notes (Signed)
Patient ID: Jay Lynch, male   DOB: 09/27/1990, 25 y.o.   MRN: [id number]     Cardiology Office Note   Date:  06/14/2015   ID:  Jay Lynch, DOB 03-28-1991, MRN [id number]  PCP:  No PCP Per Patient  Cardiologist:   Charlton Haws, MD   Chief Complaint  Patient presents with  . Chest Pain    chest tightness/pressure/ been going on a few years, per pt      History of Present Illness: Jay Lynch is a 25 y.o. 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  Failed ablation attempt ? Multiple pathways and one being posterior septal with increased risk of needing pacer if aggressively ablatated  Hadd ADD, Eczema and smokes   He works at a [employer].  Has SSCP on a weekly basis. Pain can be exertional but not always.  Pressure in chest no sharp or pleuritic.  Last minutes to hours. Been going on last few months getting worse and not positional.  Pain sometimes associated with palpitations.  Palpitations reoccurring every 2 weeks or so.  Rapid pounding in chest.  Can last minutes to hour.  No pre syncope Denies dyspnea No LE edema.  Denies excess ETOH, drugs or stimulants      Past Medical History  Diagnosis Date  . Anomalous atrioventricular excitation   . CHEST PAIN   . Wolf-Parkinson-White syndrome   . ADD (attention deficit disorder with hyperactivity)     Past Surgical History  Procedure Laterality Date  . No past surgeries       No current outpatient prescriptions on file.   No current facility-administered medications for this visit.    Allergies:   Fentanyl and Promethazine hcl    Social History:  The patient  reports that he has quit smoking. His smoking use included Cigarettes. He smoked 1.50 packs per day. He uses smokeless tobacco. He reports that he drinks about 1.8 oz of alcohol per week. He reports that he does not use illicit drugs.   Family History:  The patient's family history includes  Alzheimer's disease in his paternal grandfather; Healthy in his brother, father, mother, and sister; Heart Problems in his maternal grandmother.    ROS:  Please see the history of present illness.   Otherwise, review of systems are positive for none.   All other systems are reviewed and negative.    PHYSICAL EXAM: VS:  BP 114/67 mmHg  Pulse 102  Ht 6' (1.829 m)  Wt 84.46 kg (186 lb 3.2 oz)  BMI 25.25 kg/m2  SpO2 96% , BMI Body mass index is 25.25 kg/(m^2). Affect appropriate Healthy:  appears stated age HEENT: normal Neck supple with no adenopathy JVP normal no bruits no thyromegaly Lungs clear with no wheezing and good diaphragmatic motion Heart:  S1/S2 no murmur, no rub, gallop or click PMI normal Abdomen: benighn, BS positve, no tenderness, no AAA no bruit.  No HSM or HJR Distal pulses intact with no bruits No edema Neuro non-focal Skin warm and dry No muscular weakness    EKG:  07/23/11  SR rate 60  Normal no pre excitation    Recent Labs: No results found for requested labs within last 365 days.    Lipid Panel No results found for: CHOL, TRIG, HDL, CHOLHDL, VLDL, LDLCALC, LDLDIRECT    Wt Readings from Last 3 Encounters:  06/14/15 84.46 kg (186 lb 3.2 oz)  05/31/14 84.913 kg (187 lb 3.2 oz)  01/21/13 80.032 kg (176 lb 7 oz)      Other studies Reviewed: Additional studies/ records that were reviewed today include: Old notes from Dr Ladona Ridgel Ablation and notes from Primary Dr Neva Seat see HPI.    ASSESSMENT AND PLAN:  1.  Chest Pain worsening Baseline ECG ok Favor cardiac CTA to assess anatomy since he has had WPW higher risk of anomalous coronary arteries.  Can also assess Lungs, aorta and other mediastinal structures that could be responsible for pain 2. WPW Persistent posterior septal pathway.  Will refer back to Dr Ladona Ridgel to see if he wants him to have ILD, monitor or repeat EP study  Technology may have changed To make approaching posterior septal pathway  easier and less riskly 3. Smoking  Discussed smoking cessation with patient and parents    Current medicines are reviewed at length with the patient today.  The patient does not have concerns regarding medicines.  The following changes have been made:  no change  Labs/ tests ordered today include:  Cardiac CTA  Refer EP   Orders Placed This Encounter  Procedures  . CT Heart Morp W/Cta Cor W/Score W/Ca W/Cm &/Or Wo/Cm  . EKG 12-Lead     Disposition:   FU with Dr Ladona Ridgel EP     Signed, Charlton Haws, MD  06/14/2015 4:06 PM    [facility] [address] [place], [place], [place]  [address] Phone: [phone]; Fax: [phone]

## 2015-06-14 ENCOUNTER — Ambulatory Visit (INDEPENDENT_AMBULATORY_CARE_PROVIDER_SITE_OTHER): Payer: BLUE CROSS/BLUE SHIELD | Admitting: Cardiovascular Disease

## 2015-06-14 ENCOUNTER — Encounter: Payer: Self-pay | Admitting: Cardiovascular Disease

## 2015-06-14 VITALS — BP 114/67 | HR 102 | Ht 72.0 in | Wt 186.2 lb

## 2015-06-14 DIAGNOSIS — R0789 Other chest pain: Secondary | ICD-10-CM

## 2015-06-14 DIAGNOSIS — R079 Chest pain, unspecified: Secondary | ICD-10-CM

## 2015-06-14 NOTE — Patient Instructions (Addendum)
Medication Instructions:  Your physician recommends that you continue on your current medications as directed. Please refer to the Current Medication list given to you today.  Labwork: NONE  Testing/Procedures: Your physician has requested that you have cardiac CT. Cardiac computed tomography (CT) is a painless test that uses an x-ray machine to take clear, detailed pictures of your heart. For further information please visit [email or web address] Please follow instruction sheet as given.  Follow-Up: Your physician wants you to follow-up with Dr. Ladona Ridgel as soon as possible. Your physician recommends that you schedule a follow-up appointment as needed for Dr. Eden Emms.   If you need a refill on your cardiac medications before your next appointment, please call your pharmacy.

## 2015-06-19 ENCOUNTER — Encounter: Payer: Self-pay | Admitting: Cardiovascular Disease

## 2015-06-20 ENCOUNTER — Other Ambulatory Visit: Payer: Self-pay | Admitting: Internal Medicine

## 2015-06-20 ENCOUNTER — Ambulatory Visit (INDEPENDENT_AMBULATORY_CARE_PROVIDER_SITE_OTHER): Payer: BLUE CROSS/BLUE SHIELD | Admitting: Internal Medicine

## 2015-06-20 ENCOUNTER — Encounter: Payer: Self-pay | Admitting: Internal Medicine

## 2015-06-20 VITALS — BP 122/80 | HR 70 | Ht 72.0 in | Wt 186.8 lb

## 2015-06-20 DIAGNOSIS — I456 Pre-excitation syndrome: Secondary | ICD-10-CM

## 2015-06-20 DIAGNOSIS — I471 Supraventricular tachycardia: Secondary | ICD-10-CM

## 2015-06-20 NOTE — Progress Notes (Signed)
HPI [name] returns today after a long absence from our arrhythmia clinic. 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 At the time he was pre-excited with a right posteroseptal PW and with RF energy, his pathway would stop conducting and he also had evidence of an additional pathway on the left side. He was referred to [facility] and underwent a second ablation where they told his family that the "improved things" but did not cure him of SVT. He has continued to have recurrent symptoms but on ECG is no longer pre-excited. He notes that with exertion or blunt trauma to his chest, his heart will race and this will last for 10-15 minutes before quieting down. He is not on an medications to prevent SVT. He has noted chest pressure and was seen by Dr. Eden Lynch and a cardiac CT was ordered.   Allergies  Allergen Reactions  . Fentanyl Rash  . Promethazine Hcl Palpitations and Other (See Comments)    shaking     No current outpatient prescriptions on file.   No current facility-administered medications for this visit.     Past Medical History  Diagnosis Date  . Anomalous atrioventricular excitation   . CHEST PAIN   . Wolf-Parkinson-White syndrome   . ADD (attention deficit disorder with hyperactivity)     ROS:   All systems reviewed and negative except as noted in the HPI.   Past Surgical History  Procedure Laterality Date  . No past surgeries       Family History  Problem Relation Age of Onset  . Healthy Mother     AGE 79  . Healthy Father     AGE 64  . Healthy Brother     AGE 61  . Healthy Sister     AGE 4  . Heart Problems Maternal Grandmother   . Alzheimer's disease Paternal Grandfather      Social History   Social History  . Marital Status: Single    Spouse Name: N/A  . Number of Children: N/A  . Years of Education: N/A   Occupational History  . Not on file.    Social History Main Topics  . Smoking status: Former Smoker -- 1.50 packs/day    Types: Cigarettes  . Smokeless tobacco: Current User  . Alcohol Use: 1.8 oz/week    3 Shots of liquor per week     Comment: whiskey at least twice a week - 3/4 shots & some mixed drinks  . Drug Use: No  . Sexual Activity: Not on file   Other Topics Concern  . Not on file   Social History Narrative     BP 122/80 mmHg  Pulse 70  Ht 6' (1.829 m)  Wt 186 lb 12.8 oz (84.732 kg)  BMI 25.33 kg/m2  Physical Exam:  Well appearing 25 yo man, NAD HEENT: Unremarkable except for ear plugs in his ear lobe. Neck:  6 cm JVD, no thyromegally Lymphatics:  No adenopathy Back:  No CVA tenderness Lungs:  Clear with no wheezes HEART:  Regular rate rhythm, no murmurs, no rubs, no clicks Abd:  soft, positive bowel sounds, no organomegally, no rebound, no guarding Ext:  2 plus pulses, no edema, no cyanosis, no clubbing Skin:  No rashes no nodules Neuro:  CN II through XII intact, motor grossly intact  EKG - NSR with no ventricular pre-excitation.  Assess/Plan: 1. SVT - he still is having symptoms which occur several times a month and last for 10-15 minutes. I have discussed the treatment options with the patient. We will ask him to wear a cardiac monitor and record and episode of SVT so that we can know how to council him with regard to catheter ablation. He is not pre-excited as he had been previously. We will attempt to obtain records of his EP study at [facility]. 2. Chest pain - he will undergo CT of heart under the direction of Dr. Eden Lynch.  Jay Lynch.D.

## 2015-06-20 NOTE — Patient Instructions (Signed)
Medication Instructions:  Your physician recommends that you continue on your current medications as directed. Please refer to the Current Medication list given to you today.   Labwork: None ordered   Testing/Procedures: Your physician has recommended that you wear an event monitor. Event monitors are medical devices that record the heart's electrical activity. Doctors most often us these monitors to diagnose arrhythmias. Arrhythmias are problems with the speed or rhythm of the heartbeat. The monitor is a small, portable device. You can wear one while you do your normal daily activities. This is usually used to diagnose what is causing palpitations/syncope (passing out).    Follow-Up: Your physician recommends that you schedule a follow-up appointment in: 6 weeks with Dr Taylor   Any Other Special Instructions Will Be Listed Below (If Applicable).     If you need a refill on your cardiac medications before your next appointment, please call your pharmacy.   

## 2015-06-24 ENCOUNTER — Ambulatory Visit (INDEPENDENT_AMBULATORY_CARE_PROVIDER_SITE_OTHER): Payer: BLUE CROSS/BLUE SHIELD

## 2015-06-24 DIAGNOSIS — I471 Supraventricular tachycardia: Secondary | ICD-10-CM | POA: Diagnosis not present

## 2015-06-24 DIAGNOSIS — I456 Pre-excitation syndrome: Secondary | ICD-10-CM

## 2015-06-27 ENCOUNTER — Ambulatory Visit (HOSPITAL_COMMUNITY)
Admission: RE | Admit: 2015-06-27 | Discharge: 2015-06-27 | Disposition: A | Discharge status: Home / Self Care | Payer: BLUE CROSS/BLUE SHIELD | Source: Ambulatory Visit | Attending: Cardiovascular Disease | Admitting: Cardiovascular Disease

## 2015-06-27 ENCOUNTER — Encounter (HOSPITAL_COMMUNITY): Payer: Self-pay

## 2015-06-27 DIAGNOSIS — R079 Chest pain, unspecified: Secondary | ICD-10-CM

## 2015-06-27 MED ORDER — IOHEXOL 350 MG/ML SOLN
80.0000 mL | Freq: Once | INTRAVENOUS | Status: AC | PRN
  Administered 2015-06-27: 80 mL via INTRAVENOUS

## 2015-06-27 MED ORDER — METOPROLOL TARTRATE 1 MG/ML IV SOLN
INTRAVENOUS | Status: AC
  Administered 2015-06-27: 09:00:00 5 mg via INTRAVENOUS
  Filled 2015-06-27: qty 5

## 2015-06-27 MED ORDER — NITROGLYCERIN 0.4 MG SL SUBL
SUBLINGUAL_TABLET | SUBLINGUAL | Status: AC
  Filled 2015-06-27: qty 1

## 2015-06-27 MED ORDER — NITROGLYCERIN 0.4 MG SL SUBL
0.4000 mg | SUBLINGUAL_TABLET | SUBLINGUAL | Status: DC | PRN
Start: 2015-06-27 — End: 2015-06-28
  Administered 2015-06-27: 0.4 mg via SUBLINGUAL

## 2015-06-27 MED ORDER — METOPROLOL TARTRATE 1 MG/ML IV SOLN
5.0000 mg | Freq: Once | INTRAVENOUS | Status: AC
  Administered 2015-06-27: 5 mg via INTRAVENOUS

## 2015-08-06 ENCOUNTER — Encounter: Payer: Self-pay | Admitting: Internal Medicine

## 2015-08-06 ENCOUNTER — Ambulatory Visit (INDEPENDENT_AMBULATORY_CARE_PROVIDER_SITE_OTHER): Payer: BLUE CROSS/BLUE SHIELD | Admitting: Internal Medicine

## 2015-08-06 VITALS — BP 118/60 | HR 78 | Ht 72.0 in | Wt 189.8 lb

## 2015-08-06 DIAGNOSIS — R002 Palpitations: Secondary | ICD-10-CM

## 2015-08-06 NOTE — Progress Notes (Signed)
HPI [name] returns today for arrhythmia followup. He is a pleasant now 25 yo man with a h/o WPW who underwent unsuccessful ablation by me 8 years ago when he was 16. At the time he was pre-excited with a right posteroseptal PW and with RF energy, his pathway would stop conducting and he also had evidence of an additional pathway on the left side. [facility] and underwent a second ablation where they told his family that the "improved things" but did not cure him of SVT. He has continued to have recurrent symptoms but on ECG is no longer pre-excited. He notes that with exertion or blunt trauma to his chest, his heart will race and this will last for 10-15 minutes before quieting down. He is not on an medications to prevent SVT. He has noted chest pressure and was seen by Dr. Eden EmmsNishan and a cardiac CT was ordered. When I saw him I had him wear a cardiac monitor which has shown sinus tachycardia but no SVT.    Allergies  Allergen Reactions  . Fentanyl Rash  . Promethazine Hcl Palpitations and Other (See Comments)    shaking     No current outpatient prescriptions on file.   No current facility-administered medications for this visit.     Past Medical History  Diagnosis Date  . Anomalous atrioventricular excitation   . CHEST PAIN   . Wolf-Parkinson-White syndrome   . ADD (attention deficit disorder with hyperactivity)     ROS:   All systems reviewed and negative except as noted in the HPI.   Past Surgical History  Procedure Laterality Date  . No past surgeries       Family History  Problem Relation Age of Onset  . Healthy Mother     AGE 25  . Healthy Father     AGE 17  . Healthy Brother     AGE 12  . Healthy Sister     AGE 27  . Heart Problems Maternal Grandmother   . Alzheimer's disease Paternal Grandfather      Social History   Social History  . Marital Status: Single    Spouse Name: N/A  . Number of Children: N/A    . Years of Education: N/A   Occupational History  . Not on file.   Social History Main Topics  . Smoking status: Former Smoker -- 1.50 packs/day    Types: Cigarettes  . Smokeless tobacco: Current User  . Alcohol Use: 1.8 oz/week    3 Shots of liquor per week     Comment: whiskey at least twice a week - 3/4 shots & some mixed drinks  . Drug Use: No  . Sexual Activity: Not on file   Other Topics Concern  . Not on file   Social History Narrative     BP 118/60 mmHg  Pulse 78  Ht 6' (1.829 m)  Wt 189 lb 12.8 oz (86.093 kg)  BMI 25.74 kg/m2  Physical Exam:  Well appearing 25 yo man, NAD HEENT: Unremarkable except for ear plugs in his ear lobe. Neck:  6 cm JVD, no thyromegally Back:  No CVA tenderness Lungs:  Clear with no wheezes HEART:  Regular rate rhythm, no murmurs, no rubs, no clicks Abd:  soft, positive bowel sounds, no organomegally, no rebound, no guarding Ext:  2 plus pulses, no edema, no cyanosis, no clubbing Skin:  No rashes no nodules Neuro:  CN II through XII  intact, motor grossly intact  Cardiac monitor - nsr/sinus tachycardia  Assess/Plan: 1. Palpitations - he still is having symptoms which appear to all be due to sinus tachycardia. I have reassured the patient. If his symptoms worsen, then would consider adding a beta blocker. He is encouraged to avoid caffeine. 2. Chest pain - his CT scan shows no coronary calcium  Leonia Reeves.D.

## 2015-08-06 NOTE — Patient Instructions (Signed)
Medication Instructions:  Your physician recommends that you continue on your current medications as directed. Please refer to the Current Medication list given to you today.  Labwork: None ordered.  Testing/Procedures: None ordered.  Follow-Up: Your physician recommends that you schedule a follow-up appointment as needed with Dr. Taylor  Any Other Special Instructions Will Be Listed Below (If Applicable).     If you need a refill on your cardiac medications before your next appointment, please call your pharmacy.  

## 2015-12-11 ENCOUNTER — Encounter: Payer: Self-pay | Admitting: Physician Assistant

## 2015-12-11 ENCOUNTER — Ambulatory Visit (INDEPENDENT_AMBULATORY_CARE_PROVIDER_SITE_OTHER): Payer: BLUE CROSS/BLUE SHIELD | Admitting: Physician Assistant

## 2015-12-11 VITALS — BP 140/80 | HR 80 | Temp 97.9°F | Resp 20 | Ht 72.0 in | Wt 192.6 lb

## 2015-12-11 DIAGNOSIS — L559 Sunburn, unspecified: Secondary | ICD-10-CM

## 2015-12-11 DIAGNOSIS — L578 Other skin changes due to chronic exposure to nonionizing radiation: Secondary | ICD-10-CM

## 2015-12-11 MED ORDER — HYDROXYZINE HCL 10 MG PO TABS: 10.0000 mg | ORAL_TABLET | Freq: Three times a day (TID) | ORAL | 0 refills | Status: AC | PRN

## 2015-12-11 MED ORDER — CETIRIZINE HCL 10 MG PO TABS: 10.0000 mg | ORAL_TABLET | Freq: Every day | ORAL | 0 refills | Status: AC

## 2015-12-11 NOTE — Progress Notes (Signed)
   12/11/2015 10:51 AM   DOB: 1990/05/31 / MRN: [id number]  SUBJECTIVE:  Jay Lynch is a 25 y.o. male presenting for itching about the chest, back .  [place].  He has been taking benadryl with mild to moderate relief however he feels that he is getting worse.  Denies any medication changes over the last month. There is no lip, throat or tongue swelling.  He is allergic to fentanyl and promethazine hcl.   He  has a past medical history of ADD (attention deficit disorder with hyperactivity); Anomalous atrioventricular excitation; CHEST PAIN; and Wolf-Parkinson-White syndrome.    He  reports that he has quit smoking. His smoking use included Cigarettes. He smoked 1.50 packs per day. He uses smokeless tobacco. He reports that he drinks about 1.8 oz of alcohol per week . He reports that he does not use drugs. He  has no sexual activity history on file. The patient  has a past surgical history that includes No past surgeries.  His family history includes Alzheimer's disease in his paternal grandfather; Healthy in his brother, father, mother, and sister; Heart Problems in his maternal grandmother.  Review of Systems  Cardiovascular: Negative for chest pain.  Musculoskeletal: Negative for myalgias.  Skin: Positive for itching and rash.  Neurological: Negative for dizziness and headaches.    The problem list and medications were reviewed and updated by myself where necessary and exist elsewhere in the encounter.   OBJECTIVE:  BP 140/80   Pulse 80   Temp 97.9 F (36.6 C) (Oral)   Resp 20   Ht 6' (1.829 m)   Wt 192 lb 9.6 oz (87.4 kg)   SpO2 98%   BMI 26.12 kg/m   Physical Exam  Constitutional: He is oriented to person, place, and time.  HENT:  Mouth/Throat: Uvula is midline, oropharynx is clear and moist and mucous membranes are normal.  Cardiovascular: Normal rate and regular rhythm.   Pulmonary/Chest: Effort normal and breath  sounds normal.  Abdominal: Soft. Bowel sounds are normal.  Musculoskeletal: Normal range of motion.  Neurological: He is alert and oriented to person, place, and time.  Skin: Skin is warm and dry. Rash (skin peeling on his back, blanching erythematous rash on his stomach and chest.  ) noted.    No results found for this or any previous visit (from the past 72 hour(s)).  No results found.  ASSESSMENT AND PLAN  Jay LongsJoseph was seen today for pruritis.  Diagnoses and all orders for this visit:  Dermatitis due to sunburn: Advised vaseline and the below.  This appears to be uncomplicated.  -     cetirizine (ZYRTEC) 10 MG tablet; Take 1 tablet (10 mg total) by mouth daily. -     hydrOXYzine (ATARAX/VISTARIL) 10 MG tablet; Take 1 tablet (10 mg total) by mouth 3 (three) times daily as needed.    The patient is advised to call or return to clinic if he does not see an improvement in symptoms, or to seek the care of the closest emergency department if he worsens with the above plan.   Deliah BostonMichael Marrian Lynch, MHS, PA-C [facility] 12/11/2015 10:51 AM

## 2015-12-11 NOTE — Patient Instructions (Addendum)
Please use Vaseline only for itching and moisture retention. Take zyrtec one daily.  Alternate benadryl and Atarax for acute itching.     IF you received an x-ray today, you will receive an invoice from [facility]. Please contact [facility] at [phone] with questions or concerns regarding your invoice.   IF you received labwork today, you will receive an invoice from [facility]. Please contact [facility] at [phone] with questions or concerns regarding your invoice.   Our billing staff will not be able to assist you with questions regarding bills from these companies.  You will be contacted with the lab results as soon as they are available. The fastest way to get your results is to activate your My Chart account. Instructions are located on the last page of this paperwork. If you have not heard from us regarding the results in 2 weeks, please contact this office.
